# Patient Record
Sex: Male | Born: 1940 | Race: White | Hispanic: No | Marital: Married | Smoking: Former smoker
Health system: Southern US, Community
[De-identification: ages and names within clinical notes are randomized; demographics above are authoritative.]

## PROBLEM LIST (undated history)

## (undated) DIAGNOSIS — K7030 Alcoholic cirrhosis of liver without ascites: Secondary | ICD-10-CM

## (undated) HISTORY — PX: CATARACT EXTRACTION, BILATERAL: SHX1313

## (undated) HISTORY — DX: Alcoholic cirrhosis of liver without ascites: K70.30

---

## 2008-05-24 HISTORY — PX: UMBILICAL HERNIA REPAIR: SHX196

## 2009-06-05 ENCOUNTER — Encounter: Payer: Self-pay | Admitting: Family Medicine

## 2009-06-11 ENCOUNTER — Ambulatory Visit: Payer: Self-pay | Admitting: Family Medicine

## 2009-06-11 DIAGNOSIS — K7030 Alcoholic cirrhosis of liver without ascites: Secondary | ICD-10-CM | POA: Insufficient documentation

## 2009-06-11 DIAGNOSIS — J984 Other disorders of lung: Secondary | ICD-10-CM | POA: Insufficient documentation

## 2009-06-12 ENCOUNTER — Encounter: Payer: Self-pay | Admitting: Family Medicine

## 2009-06-12 LAB — CONVERTED CEMR LAB
AFP-Tumor Marker: 5.7 ng/mL (ref 0.0–8.0)
BUN: 18 mg/dL (ref 6–23)
CO2: 23 meq/L (ref 19–32)
Glucose, Bld: 103 mg/dL — ABNORMAL HIGH (ref 70–99)
HCT: 38.3 % — ABNORMAL LOW (ref 39.0–52.0)
Hemoglobin: 12.8 g/dL — ABNORMAL LOW (ref 13.0–17.0)
MCHC: 33.4 g/dL (ref 30.0–36.0)
MCV: 91.4 fL (ref 78.0–100.0)
Platelets: 198 10*3/uL (ref 150–400)
RDW: 15.5 % (ref 11.5–15.5)
Sodium: 134 meq/L — ABNORMAL LOW (ref 135–145)
Total Bilirubin: 0.8 mg/dL (ref 0.3–1.2)
Total Protein: 8.7 g/dL — ABNORMAL HIGH (ref 6.0–8.3)

## 2009-09-09 ENCOUNTER — Encounter: Payer: Self-pay | Admitting: Family Medicine

## 2009-10-10 ENCOUNTER — Encounter: Payer: Self-pay | Admitting: Family Medicine

## 2009-10-14 ENCOUNTER — Encounter: Payer: Self-pay | Admitting: Family Medicine

## 2009-10-21 ENCOUNTER — Encounter: Payer: Self-pay | Admitting: Family Medicine

## 2009-10-24 ENCOUNTER — Encounter: Payer: Self-pay | Admitting: Family Medicine

## 2009-10-31 ENCOUNTER — Ambulatory Visit: Payer: Self-pay | Admitting: Family Medicine

## 2009-11-01 ENCOUNTER — Encounter: Payer: Self-pay | Admitting: Family Medicine

## 2009-11-03 ENCOUNTER — Encounter: Payer: Self-pay | Admitting: Family Medicine

## 2009-11-03 LAB — CONVERTED CEMR LAB
ALT: <8 units/L (ref 0–53)
CO2: 21 meq/L (ref 19–32)
Calcium: 9.3 mg/dL (ref 8.4–10.5)
Chloride: 99 meq/L (ref 96–112)
Creatinine, Ser: 1.98 mg/dL — ABNORMAL HIGH (ref 0.40–1.50)
Glucose, Bld: 136 mg/dL — ABNORMAL HIGH (ref 70–99)
HCT: 29.5 % — ABNORMAL LOW (ref 39.0–52.0)
Platelets: 204 10*3/uL (ref 150–400)
RBC: 3.32 M/uL — ABNORMAL LOW (ref 4.22–5.81)
Sodium: 133 meq/L — ABNORMAL LOW (ref 135–145)
Total Protein: 7.8 g/dL (ref 6.0–8.3)
WBC: 7.2 10*3/uL (ref 4.0–10.5)

## 2009-12-01 ENCOUNTER — Telehealth: Payer: Self-pay | Admitting: Family Medicine

## 2010-01-08 ENCOUNTER — Ambulatory Visit: Payer: Self-pay | Admitting: Family Medicine

## 2010-01-08 DIAGNOSIS — R188 Other ascites: Secondary | ICD-10-CM

## 2010-01-08 DIAGNOSIS — D239 Other benign neoplasm of skin, unspecified: Secondary | ICD-10-CM | POA: Insufficient documentation

## 2010-01-09 ENCOUNTER — Encounter: Payer: Self-pay | Admitting: Family Medicine

## 2010-03-18 ENCOUNTER — Encounter: Payer: Self-pay | Admitting: Family Medicine

## 2010-04-07 ENCOUNTER — Encounter: Payer: Self-pay | Admitting: Family Medicine

## 2010-04-29 ENCOUNTER — Encounter: Payer: Self-pay | Admitting: Family Medicine

## 2010-05-01 ENCOUNTER — Encounter: Payer: Self-pay | Admitting: Family Medicine

## 2010-05-06 ENCOUNTER — Encounter: Payer: Self-pay | Admitting: Family Medicine

## 2010-05-28 ENCOUNTER — Encounter: Payer: Self-pay | Admitting: Family Medicine

## 2010-06-22 ENCOUNTER — Encounter: Payer: Self-pay | Admitting: Family Medicine

## 2010-06-23 NOTE — Consult Note (Signed)
Summary: [facility]   Imported By: Lanelle Bal 09/18/2009 10:51:13  _____________________________________________________________________  External Attachment:    Type:   Image     Comment:   External Document

## 2010-06-23 NOTE — Miscellaneous (Signed)
Summary: colonoscopy  Clinical Lists Changes  Observations: Added new observation of COLONOSCOPY: [facility] hx of cirrhosis, portal HTN, colon polyps  Sigmoid polyps, resected and retrieved. 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 scattered AVMs, non bleeding o/w normal.   (10/10/2009 11:49)      Colonoscopy  Procedure date:  10/10/2009  Findings:      [facility] hx of cirrhosis, portal HTN, colon polyps  Sigmoid polyps, resected and retrieved. 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 scattered AVMs, non bleeding o/w normal.     Colonoscopy  Procedure date:  10/10/2009  Findings:      [facility] hx of cirrhosis, portal HTN, colon polyps  Sigmoid polyps, resected and retrieved. 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 scattered AVMs, non bleeding o/w normal.

## 2010-06-23 NOTE — Consult Note (Signed)
Summary: [facility]   Imported By: Lanelle Bal 04/06/2010 08:01:26  _____________________________________________________________________  External Attachment:    Type:   Image     Comment:   External Document

## 2010-06-23 NOTE — Procedures (Signed)
Summary: Colonoscopy/[facility]  Colonoscopy/[facility]   Imported By: Lanelle Bal 10/23/2009 10:56:36  _____________________________________________________________________  External Attachment:    Type:   Image     Comment:   External Document

## 2010-06-23 NOTE — Progress Notes (Signed)
Summary: Potassium refill  Phone Note Refill Request   Refills Requested: Medication #1:  POTASSIUM CHLORIDE CR 10 MEQ CR-TABS Take 1 tab by mouth once daily Is this OK to refill?  Initial call taken by: Payton Spark CMA,  December 01, 2009 9:39 AM  Follow-up for Phone Call        NO.  Will STOP his potassium.   Follow-up by: Seymour Bars DO,  December 01, 2009 10:01 AM     Appended Document: Potassium refill Wife aware.Arvilla Market CMA, Michelle December 01, 2009 10:07 AM

## 2010-06-23 NOTE — Letter (Signed)
Summary: [facility]  [facility]   Imported By: Lanelle Bal 10/31/2009 14:11:02  _____________________________________________________________________  External Attachment:    Type:   Image     Comment:   External Document

## 2010-06-23 NOTE — Letter (Signed)
Summary: [facility]  [facility]   Imported By: Lanelle Bal 11/07/2009 11:47:44  _____________________________________________________________________  External Attachment:    Type:   Image     Comment:   External Document

## 2010-06-23 NOTE — Assessment & Plan Note (Signed)
Summary: NOV cirrhosis   Vital Signs:  Patient profile:   70 year old male Height:      70 inches Weight:      166 pounds BMI:     23.90 O2 Sat:      99 % on Room air Temp:     98.0 degrees F oral Pulse rate:   86 / minute BP sitting:   134 / 70  (left arm) Cuff size:   regular  Vitals Entered By: Payton Spark CMA (June 11, 2009 1:37 PM)  O2 Flow:  Room air CC: New to est   Primary Care Provider:  Seymour Bars DO  CC:  New to est.  History of Present Illness: 70 yo WM presents for NOV.  He was diagnosed with alcoholic cirrhosis in March of last year.  He is seeing [facility] for this.  He quit drinking Feb 2010.  He is on Spironolactone, Furosemide and Lactulose.  He has had ascites tapped 4 x already.  He had an EGD and colonoscopy last year and had colon polyps.  He has not had any upper GI tract bleeding.  He is not on a beta blocker.    He is seeing [facility] for a pulmonary nodule.  He smokes a ppd x 40+ yrs.  This has been followed with serial CT scans.  He has no complaints today.    Current Medications (verified): 1)  Spironolactone 50 Mg Tabs (Spironolactone) .... Take 4 Tabs By Mouth Daily 2)  Furosemide 80 Mg Tabs (Furosemide) .... Take 1 Tab By Mouth Once Daily 3)  Potassium Chloride Cr 10 Meq Cr-Tabs (Potassium Chloride) .... Take 1 Tab By Mouth Once Daily 4)  Prilosec Otc 20 Mg Tbec (Omeprazole Magnesium) .... Take 1 Tab By Mouth Once Daily 5)  Miralax  Pack (Polyethylene Glycol 3350) .... Use As Directed 6)  Milk Thistle 175 Mg Caps (Milk Thistle) .... Take 1 Cap By Mouth Once Daily 7)  Tramadol Hcl 50 Mg Tabs (Tramadol Hcl) .... Take As Directed As Needed 8)  Temazepam 30 Mg Caps (Temazepam) .... Take 1 Cap By Mouth Once Daily 9)  Lactulose 10 Gm/59ml Soln (Lactulose) .Marland Kitchen.. 15 Ml Once Daily  Allergies (verified): No Known Drug Allergies  Past History:  Past Medical History: Alcoholic Cirrhosis ( Dr Steffanie Dunn ) [facility] Nod[facility] )  getting f/u CT scans  Past Surgical History: cataract surgery 09, [facility]  Family History: 3 sisters  mother AMI 54s father CVA 12s, DM  Social History: Retired from [employer] work. Married (2nd marriage).  Has 3 children. Smokes 1 ppd x 40 yrs. Quit ETOH 06-2008. No exercise.  Review of Systems       no fevers/sweats/weakness, unexplained wt loss/gain, no change in vision, + difficulty hearing, ringing in ears, no hay fever/allergies, no CP/discomfort, no palpitations, no breast lump/nipple discharge, no cough/wheeze, no blood in stool,no  N/V/D, no nocturia, no leaking urine, no unusual vag bleeding, no vaginal/penile discharge, no muscle/joint pain, no rash, no new/changing mole, no HA, + memory loss, no anxiety, + sleep problem, no depression, no unexplained lumps, no easy bruising/bleeding, no concern with sexual function   Physical Exam  General:  alert, well-developed, well-nourished, and well-hydrated.  thin appearing man. here with wife. Head:  normocephalic and atraumatic.   Ears:  bilat cerumen impaction Nose:  no nasal discharge.   Mouth:  pharynx pink and moist.   Neck:  no masses.  no JVD Lungs:  prolonged exp phase with faint bibasilar exp wheezing.   Heart:  RRR w/o audible murmurs Abdomen:  reproducible umbilical hernia, quarter sized small fluid wave present.  NABS.  soft.  ND. Extremities:  no LE edema Neurologic:  no asterixis Skin:  color normal.   Cervical Nodes:  No lymphadenopathy noted Psych:  good eye contact, not anxious appearing, and not depressed appearing.     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  Fax to [facility] who is following him for his cirrhosis, complicated by encephalopathy (on lactulose) and ascites.  Asymptomatic at this point and no longer drinking.  No sign of GI bleeding.   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 He likely has COPD given exam findings and pack year hx.   Set up for PFTs.  Complete Medication List: 1)  Spironolactone 50 Mg Tabs (Spironolactone) .... Take 4 tabs by mouth daily 2)  Furosemide 80 Mg Tabs (Furosemide) .... Take 1 tab by mouth once daily 3)  Potassium Chloride Cr 10 Meq Cr-tabs (Potassium chloride) .... Take 1 tab by mouth once daily 4)  Prilosec Otc 20 Mg Tbec (Omeprazole magnesium) .... Take 1 tab by mouth once daily 5)  Miralax Pack (Polyethylene glycol 3350) .... Use as directed 6)  Milk Thistle 175 Mg Caps (Milk thistle) .... Take 1 cap by mouth once daily 7)  Tramadol Hcl 50 Mg Tabs (Tramadol hcl) .... Take as directed as needed 8)  Temazepam 30 Mg Caps (Temazepam) .... Take 1 cap by mouth once daily 9)  Lactulose 10 Gm/83ml Soln (Lactulose) .Marland Kitchen.. 15 ml once daily  Patient Instructions: 1)  Ear irrigation today. 2)  Labs downstairs today. 3)  Will call you w/ results tomorrow and send a copy to Dr Opal Sidles. 4)  Return for Pulmonary Function Testing in 1 month.

## 2010-06-23 NOTE — Letter (Signed)
Summary: [facility]   Imported By: Lanelle Bal 06/24/2009 14:23:48  _____________________________________________________________________  External Attachment:    Type:   Image     Comment:   External Document

## 2010-06-23 NOTE — Letter (Signed)
Summary: [facility]  [facility]   Imported By: Lanelle Bal 01/16/2010 12:20:36  _____________________________________________________________________  External Attachment:    Type:   Image     Comment:   External Document

## 2010-06-23 NOTE — Assessment & Plan Note (Signed)
Summary: skin lesion/ ascites   Vital Signs:  Patient profile:   70 year old male Height:      70 inches Weight:      183 pounds BMI:     26.35 O2 Sat:      97 % on Room air Pulse rate:   83 / minute BP sitting:   111 / 71  (left arm) Cuff size:   regular  Vitals Entered By: Payton Spark CMA (January 08, 2010 8:42 AM)  O2 Flow:  Room air CC: Black mole on R upper back   Primary Care Provider:  Seymour Bars DO  CC:  Black mole on R upper back.  History of Present Illness: 70yo WM seen today for a mole on the R upper back that his wife states has changed.  It started like the other SKs aroiund it but has become black with speckles.  No change in growth.  Asymptomatic per pt report.  His other question for me today is if he can schedule a paracentesis for his ascites.  It has been 2 mos since the last one and his wife reports having to wait 2+ hrs at [facility] in [place] at short stay last time.    His last OV with Dr Opal Sidles [facility]) was April 2011.  His hx is that of alcholic cirrhosis  with hepatic encephalopathy (hosp for in May 2011), Portal HTN, ascites, and ARF during hosp stay in May.  He notes that his abdomen feels tight.  Denies early satiety, N/V, blood in the stool, fevers or chills.  Denies wt loss or leg swelling.  Wife reports his mental status to be at baseline.  He is on lactulose to achieve 2-3 soft BMs/ day.  Last EGD was 08-2008 showing portal HTN gastropathy.  Last set of labs done 08-2009.  Quit drinking ETOH 2009.  Current Medications (verified): 1)  Spironolactone 50 Mg Tabs (Spironolactone) .... Take 4 Tabs By Mouth Daily 2)  Furosemide 80 Mg Tabs (Furosemide) .... Take 1 Tab By Mouth Twice A Day 3)  Prilosec Otc 20 Mg Tbec (Omeprazole Magnesium) .... Take 1 Tab By Mouth Once Daily 4)  Milk Thistle 175 Mg Caps (Milk Thistle) .... Take 1 Cap By Mouth Once Daily 5)  Lactulose 10 Gm/67ml Soln (Lactulose) .Marland Kitchen.. 15 Ml Once Daily  Allergies (verified): No Known Drug  Allergies  Past History:  Past Medical History: Alcoholic Cirrhosis ( Dr Steffanie Dunn ) [facility]od[facility] ) getting f/u CT scans  hepatic encephalopathy portal HTN Anemia Ascites Colon polyps  Social History: Reviewed history from 06/11/2009 and no changes required. Retired from [employer] work. Married (2nd marriage).  Has 3 children. Smokes 1 ppd x 40 yrs. Quit ETOH 06-2008. No exercise.  Review of Systems      See HPI  Physical Exam  General:  alert, well-developed, well-nourished, and well-hydrated.   Mouth:  pharynx pink and moist.   Neck:  no masses.   Lungs:  prolonged exp phase with faint bibasilar exp wheezing.   Heart:  RRR with 3/6 systolic ejection murmur Abdomen:  tight ascites with prominent bluish colored umbilical hernia.  non tender.   Extremities:  no LE edema Skin:  0.6 x 0.7 cm round black speckled nevus R upper back with nearby SK lesions  no jaundice spider angioma face and chest Psych:  good eye contact, not anxious appearing, and not depressed appearing.     Impression & Recommendations:  Problem # 1:  NEVUS, ATYPICAL (ICD-216.9)  Shave excision performed today w/o complication. Lesion cleaned w/ betadine and alcohol, 3 cc of 1% lidocaine iwth epi injected for local anesthesia. 15 blade used to remove lesion with ease.  Clean base.  used aluminum chloride for hemostasis. Bacitracin and a bandaid applied. Gave wound care instructions., Lesion sent to path.  Orders: Shave Skin Lesion 0.6-1.0 cm/trunk/arm/leg ([id number])  Problem # 2:  OTHER ASCITES (ICD-789.59) It has been 2 mos since last paracentesis and he feels uncofortable and 'tight' now.  Will get him scheduled for u/s guided paracentesis at [facility].  He is compliant with all of his meds for his cirrhosis.   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 He had labs done last in April 2011.  Complete Medication List: 1)  Spironolactone 50 Mg Tabs (Spironolactone) .... Take 4 tabs by mouth daily 2)  Furosemide 80 Mg Tabs (Furosemide) .... Take 1 tab by mouth twice a day 3)  Prilosec Otc 20 Mg Tbec (Omeprazole magnesium) .... Take 1 tab by mouth once daily 4)  Milk Thistle 175 Mg Caps (Milk thistle) .... Take 1 cap by mouth once daily 5)  Lactulose 10 Gm/51ml Soln (Lactulose) .Marland Kitchen.. 15 ml once daily  Patient Instructions: 1)  Will get you scheduled for a paracentesis. 2)  Marcelino Duster will call you w/ appt info. 3)  Your biopsy will be back next wk. 4)  Will call you w/ results. 5)  OK to wash lesion with soap and water. 6)  Cover with antibiotic ointment and a bandaid for the next wk. 7)  Call if any problems.   8)  Return for follow up visit in 6 wks.   Procedure Note  Biopsy:    Comment: Lidocaine 1% w/epi [id number] eXP. 11/12

## 2010-06-23 NOTE — Assessment & Plan Note (Signed)
Summary: HFU encephalopathy   Vital Signs:  Patient profile:   70 year old male Height:      70 inches Weight:      176 pounds Pulse rate:   90 / minute BP sitting:   109 / 58  (left arm) Cuff size:   regular  Vitals Entered By: Kathlene November (October 31, 2009 2:42 PM) CC: hospital followup- states feeling better   Primary Care Provider:  Seymour Bars DO  CC:  hospital followup- states feeling better.  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  He was admitted for encephalopathy after not taking his Lactulose.  His wife reports that his mental status is back to baseline.  He has about 3 loose stools/ day.  He is on Furosemide 80 mg two times a day and Sprionolactone.  Saw [facility] in April then went for a colonoscopy in May.  Had non- bleeding AVMs, portal HTN and ascites.  His ascites is getting worse but he denies early satiety or abdominal pain.  Denies hematememsis, nausea or hematochezia.        Current Medications (verified): 1)  Spironolactone 50 Mg Tabs (Spironolactone) .... Take 4 Tabs By Mouth Daily 2)  Furosemide 80 Mg Tabs (Furosemide) .... Take 1 Tab By Mouth Once Daily 3)  Potassium Chloride Cr 10 Meq Cr-Tabs (Potassium Chloride) .... Take 1 Tab By Mouth Once Daily 4)  Prilosec Otc 20 Mg Tbec (Omeprazole Magnesium) .... Take 1 Tab By Mouth Once Daily 5)  Milk Thistle 175 Mg Caps (Milk Thistle) .... Take 1 Cap By Mouth Once Daily 6)  Lactulose 10 Gm/13ml Soln (Lactulose) .Marland Kitchen.. 15 Ml Once Daily  Allergies (verified): No Known Drug Allergies  Comments:  Nurse/Medical Assistant: The patient's medications and allergies were reviewed with the patient and were updated in the Medication and Allergy Lists. Kathlene November (October 31, 2009 2:43 PM)  Past History:  Past Medical History: Reviewed history from 06/11/2009 and no changes required. Alcoholic Cirrhosis ( Dr Steffanie Dunn ) [facility] Lung Nod[facility] )  getting f/u CT scans  Social History: Reviewed history from 06/11/2009 and no changes required. Retired from [employer] work. Married (2nd marriage).  Has 3 children. Smokes 1 ppd x 40 yrs. Quit ETOH 06-2008. No exercise.  Review of Systems      See HPI  Physical Exam  General:  alert, well-developed, well-nourished, and well-hydrated.   Head:  normocephalic and atraumatic.   Eyes:  sclera non icteric Mouth:  pharynx pink and moist.   Neck:  no masses.  no JVD Lungs:  prolonged exp phase with faint bibasilar exp wheezing.   Heart:  RRR with 3/6 systolic ejection murmur Abdomen:  soft with large reducible umbilical hernia, moderate ascites with abdominal distension.  ND.  NABS. Extremities:  no LE edema Skin:  color normal.  no jaundice Psych:  Oriented X3, memory intact for recent and remote, good eye contact, not anxious appearing, and not depressed appearing.     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  This has cleared since he is back on Lactulose and recieved IVF rehydration.  Will recheck labs today.  His ascites does appear to be accumulating despite high doses of diuretics --> will get him back in with [facility] to set up paracentesis.  His Mental status appears to be back to baseline.  He was not having any GI bleeding during recent colonoscopy.   Orders: T-Comprehensive Metabolic Panel ([id number]) T-Lactic Acid. Venous [phone]) T-CBC No Diff ([id number])  Complete Medication List: 1)  Spironolactone 50 Mg Tabs (Spironolactone) .... Take 4 tabs by mouth daily 2)  Furosemide 80 Mg Tabs (Furosemide) .... Take 1 tab by mouth twice a day 3)  Potassium Chloride Cr 10 Meq Cr-tabs (Potassium chloride) .... Take 1 tab by mouth once daily 4)  Prilosec Otc 20 Mg Tbec (Omeprazole magnesium) .... Take 1 tab by mouth once daily 5)  Milk Thistle 175 Mg Caps (Milk  thistle) .... Take 1 cap by mouth once daily 6)  Lactulose 10 Gm/56ml Soln (Lactulose) .Marland Kitchen.. 15 ml once daily  Patient Instructions: 1)  Labs today. 2)  Will call you w/ results next wk. 3)  Stay on current meds. 4)  Will get you in with [facility]. 5)  Return for follow up in 3-4 mos.

## 2010-06-23 NOTE — Letter (Signed)
Summary: Letter Regarding Path Results/[facility]   Letter Regarding Path Results/[facility]   Imported By: Lanelle Bal 11/11/2009 11:55:00  _____________________________________________________________________  External Attachment:    Type:   Image     Comment:   External Document

## 2010-06-25 NOTE — Consult Note (Signed)
Summary: [facility]   Imported By: Lanelle Bal 05/08/2010 11:06:43  _____________________________________________________________________  External Attachment:    Type:   Image     Comment:   External Document

## 2010-06-25 NOTE — Letter (Signed)
Summary: [facility]  [facility]   Imported By: Lanelle Bal 06/03/2010 10:14:54  _____________________________________________________________________  External Attachment:    Type:   Image     Comment:   External Document

## 2010-07-16 ENCOUNTER — Encounter: Payer: Self-pay | Admitting: Family Medicine

## 2010-09-15 ENCOUNTER — Encounter: Payer: Self-pay | Admitting: Family Medicine

## 2010-09-29 ENCOUNTER — Ambulatory Visit (INDEPENDENT_AMBULATORY_CARE_PROVIDER_SITE_OTHER): Payer: BC Managed Care – PPO | Admitting: Family Medicine

## 2010-09-29 ENCOUNTER — Encounter: Payer: Self-pay | Admitting: Family Medicine

## 2010-09-29 VITALS — BP 121/71 | HR 80 | Ht 70.0 in | Wt 181.0 lb

## 2010-09-29 DIAGNOSIS — R600 Localized edema: Secondary | ICD-10-CM

## 2010-09-29 DIAGNOSIS — R609 Edema, unspecified: Secondary | ICD-10-CM

## 2010-09-29 LAB — POCT URINALYSIS DIPSTICK
Blood, UA: NEGATIVE
Glucose, UA: NEGATIVE
Spec Grav, UA: 1.025
Urobilinogen, UA: >=8
pH, UA: 6

## 2010-09-29 MED ORDER — FUROSEMIDE 80 MG PO TABS: 80.0000 mg | ORAL_TABLET | Freq: Every day | ORAL | Status: DC

## 2010-09-29 NOTE — Progress Notes (Signed)
  Subjective:    Patient ID: Christopher Wiley, male    DOB: 1941-03-08, 70 y.o.   MRN: [id number]  HPI 70 yo WM presents for L foot swelling worse at the end of the day x 2 wk.  Less swelling on the R side.  Little pain.  Swelling goes about 1/2 way up the L leg.  Denies problems voiding.  He is on Lasix 80 mg once daily (down from bid previously) per [facility] for ascites secondary to cirrhosis.  He is due for a paracentesis in 2 days and is having abd distension, increased DOE and early satiety.  Denies trauma to leg or calf pain.    BP 121/71  Pulse 80  Ht 5\' 10"  (1.778 m)  Wt 181 lb (82.101 kg)  BMI 25.97 kg/m2  SpO2 98%  Patient Active Problem List  Diagnoses  . NEVUS, ATYPICAL  . PULMONARY NODULE  . ALCOHOLIC CIRRHOSIS OF LIVER  . OTHER ASCITES       Review of Systems as per HPI     Objective:   Physical Exam  Constitutional: He appears well-developed and well-nourished.  HENT:  Head: Normocephalic and atraumatic.  Eyes: No scleral icterus.  Neck: Neck supple. No JVD present. No thyromegaly present.  Cardiovascular: Normal rate, regular rhythm and normal heart sounds.   Pulmonary/Chest: Effort normal and breath sounds normal. No respiratory distress. He has no wheezes. He has no rales.  Abdominal: He exhibits distension. There is no tenderness.       Ascites, moderate to severe  Musculoskeletal: He exhibits edema (L>R pitting LE edema, 1-2+ 1/3 way up LLE with varicosities, neg homans sign).  Lymphadenopathy:    He has no cervical adenopathy.  Skin: Skin is warm and dry.       bilat LE stasis dermatitis  Psychiatric: He has a normal mood and affect.          Assessment & Plan:

## 2010-09-29 NOTE — Assessment & Plan Note (Signed)
UA + for only trace protein.  Likley due to cirrhosis with portal HTN -- need for paracentesis (scheduled for 48 hrs from now).  Will check labs today.  No pain.  Will treat with leg elevation, compression hose, limited sodium consumption and continuing same dose of Lasix.  Wife will let me know if swelling improves after tap.

## 2010-09-29 NOTE — Patient Instructions (Signed)
UA:  Labs downstairs today.  Will call you w/ results tomorrow.  Swear compression hose on both legs during the day. Stick to a low sodium diet. Elevate legs while at rest.  Let me know if swelling improves or not after paracentesis is done.

## 2010-09-30 ENCOUNTER — Telehealth: Payer: Self-pay | Admitting: Family Medicine

## 2010-09-30 DIAGNOSIS — R609 Edema, unspecified: Secondary | ICD-10-CM

## 2010-09-30 LAB — COMPLETE METABOLIC PANEL WITH GFR
ALT: <8 U/L (ref 0–53)
Albumin: 3.2 g/dL — ABNORMAL LOW (ref 3.5–5.2)
CO2: 24 mEq/L (ref 19–32)
Calcium: 9.1 mg/dL (ref 8.4–10.5)
Chloride: 101 mEq/L (ref 96–112)
Creat: 1.17 mg/dL (ref 0.40–1.50)
GFR, Est African American: >60 mL/min (ref >60)
Potassium: 3.7 mEq/L (ref 3.5–5.3)

## 2010-09-30 LAB — TSH: TSH: 3.992 u[IU]/mL (ref 0.350–4.500)

## 2010-09-30 LAB — D-DIMER, QUANTITATIVE: D-Dimer, Quant: 10.78 ug/mL-FEU — ABNORMAL HIGH (ref 0.00–0.48)

## 2010-09-30 NOTE — Telephone Encounter (Signed)
Pls let pt know that his chemistry panel came back normal.  Thyroid function is normal.  His marker to r/o blood clots came back high, so will get an u/s on his L leg to be sure there is not a blood clot in his leg.

## 2010-09-30 NOTE — Telephone Encounter (Signed)
Scheduled today at 4pm. Pt aware

## 2010-10-01 ENCOUNTER — Telehealth: Payer: Self-pay | Admitting: Family Medicine

## 2010-10-01 NOTE — Telephone Encounter (Signed)
Pls make sure Christopher Wiley and his wife know that his venous doppler u/s came back negative for DVT.  I suspect his elevated D dimer was falsley elevated from his cirrhosis.

## 2010-10-01 NOTE — Telephone Encounter (Signed)
Pt aware of the above  

## 2010-10-08 ENCOUNTER — Telehealth: Payer: Self-pay | Admitting: Family Medicine

## 2010-10-08 NOTE — Telephone Encounter (Signed)
Patient's wife called and states that they DO NOT like his current doctor at [facility] and would like a new referral to go somewhere else other than [facility].

## 2010-10-08 NOTE — Telephone Encounter (Signed)
Pt scheduled w/ Dr. Theodore Demark for f/u at the end of June. Wife will see how apt goes and decide then if they want to change

## 2010-10-08 NOTE — Telephone Encounter (Signed)
I would be happy to, but most GI offices will not allow a transfer.  Is it possible for him to see another provider there?  Dr Noelle Penner is very nice.  Michellle, pls call [facility] to ask.  Thanks.

## 2010-10-08 NOTE — Telephone Encounter (Signed)
Dr. B's pt

## 2010-10-16 ENCOUNTER — Telehealth: Payer: Self-pay | Admitting: *Deleted

## 2010-10-16 ENCOUNTER — Encounter: Payer: Self-pay | Admitting: Family Medicine

## 2010-10-16 ENCOUNTER — Ambulatory Visit (INDEPENDENT_AMBULATORY_CARE_PROVIDER_SITE_OTHER): Payer: BC Managed Care – PPO | Admitting: Family Medicine

## 2010-10-16 VITALS — BP 119/68 | HR 89 | Temp 98.0°F | Ht 70.0 in | Wt 178.0 lb

## 2010-10-16 DIAGNOSIS — L0390 Cellulitis, unspecified: Secondary | ICD-10-CM | POA: Insufficient documentation

## 2010-10-16 MED ORDER — DOXYCYCLINE HYCLATE 100 MG PO CAPS: 100.0000 mg | ORAL_CAPSULE | Freq: Two times a day (BID) | ORAL | Status: AC

## 2010-10-16 NOTE — Progress Notes (Signed)
  Subjective:    Patient ID: Christopher Wiley, male    DOB: April 29, 1941, 70 y.o.   MRN: [id number]  HPI 70 yo WM presents for a non healing abrasion over the distal lateral L leg that started when he cut himself on the cardoor.  Denies bleeding or drainage from the wound.  He has been using neosporin and a bandaid.  The spot is a little tender.  His wife noticed yesterday a ring of redness, getting bigger, surrounding it.  He denies feeling bad or having a fever.  Unsure last tetanus vaccine.  BP 119/68  Pulse 89  Temp(Src) 98 F (36.7 C) (Oral)  Ht 5\' 10"  (1.778 m)  Wt 178 lb (80.74 kg)  BMI 25.54 kg/m2  SpO2 99% Patient Active Problem List  Diagnoses Date Noted  . Leg edema 09/29/2010  . NEVUS, ATYPICAL 01/08/2010  . OTHER ASCITES 01/08/2010  . PULMONARY NODULE 06/11/2009  . ALCOHOLIC CIRRHOSIS OF LIVER 06/11/2009       Review of Systems  Constitutional: Negative for fever and chills.       Objective:   Physical Exam  Constitutional: He appears well-developed and well-nourished. No distress.  Skin:       Small abrasion over distal, lateral L calf with a 2 cm ring of erythema surrounding with a small scale, warmth and slight tenderness          Assessment & Plan:

## 2010-10-16 NOTE — Assessment & Plan Note (Signed)
Localized cellulitis of the L leg secondary to an abrasion from 2 wks ago.  Will update his Tdap today and treat cellulitis with doxycycline bid x 10 days.  Keep lesion clean with soap and water and cover with polysporin ointment and gauze.  Call if any problems.  Call if fevers occur or redness increases.

## 2010-10-16 NOTE — Telephone Encounter (Signed)
Pt called and states med is not at pharm. [facility] [place]. Med was [facility]. [facility] [place]

## 2010-10-16 NOTE — Patient Instructions (Signed)
Take Doxycycline 2 x a day for localized cellulitis on leg. Clean wound with antibacterial soap and water 2 x a day and cover with polysporin ointment and gauze for 7-10 days.  Call me if any fevers, increased pain or spreading redness.

## 2010-11-27 ENCOUNTER — Encounter: Payer: Self-pay | Admitting: Family Medicine

## 2011-01-01 ENCOUNTER — Encounter: Payer: Self-pay | Admitting: Family Medicine

## 2011-01-01 ENCOUNTER — Ambulatory Visit (INDEPENDENT_AMBULATORY_CARE_PROVIDER_SITE_OTHER): Payer: BC Managed Care – PPO | Admitting: Family Medicine

## 2011-01-01 VITALS — BP 98/50 | HR 73 | Ht 70.0 in | Wt 160.0 lb

## 2011-01-01 DIAGNOSIS — K7030 Alcoholic cirrhosis of liver without ascites: Secondary | ICD-10-CM

## 2011-01-01 DIAGNOSIS — D239 Other benign neoplasm of skin, unspecified: Secondary | ICD-10-CM

## 2011-01-01 MED ORDER — TRIAMCINOLONE IN ABSORBASE 0.05 % EX OINT: TOPICAL_OINTMENT | CUTANEOUS | Status: DC

## 2011-01-01 MED ORDER — FUROSEMIDE 40 MG PO TABS: 40.0000 mg | ORAL_TABLET | Freq: Every day | ORAL | Status: AC

## 2011-01-01 NOTE — Progress Notes (Signed)
  Subjective:    Patient ID: Christopher Wiley, male    DOB: 31-Jan-1941, 70 y.o.   MRN: [id number]  HPI  70 yo WM presents for f/u visit.  He just saw Dr Opal Sidles and had labs drawn Monday.  He is on Lasix 80 mg once daily for alcoholic cirrhosis with secondary ascites and leg swelling.  He has not had a paracentesis done in 2 mos.  Was previously getting about once a month.  He is down 18 lbs in 3 mos but says he has a good appetite and is eating.  Denies problems voiding, abd pain or leg swelling.  No fevers, chills, weakness, SOb or lightheadedness.    BP 98/50  Pulse 73  Ht 5\' 10"  (1.778 m)  Wt 160 lb (72.576 kg)  BMI 22.96 kg/m2  SpO2 96%    Review of Systems  Constitutional: Positive for fatigue and unexpected weight change. Negative for fever.  Respiratory: Negative for shortness of breath.   Cardiovascular: Negative for chest pain, palpitations and leg swelling.  Gastrointestinal: Negative for abdominal pain, diarrhea, blood in stool and abdominal distention.  Genitourinary: Negative for difficulty urinating.  Skin: Negative for color change.  Neurological: Negative for light-headedness and headaches.       Objective:   Physical Exam  Constitutional: He appears well-developed and well-nourished.  HENT:  Mouth/Throat: Oropharynx is clear and moist.  Eyes: No scleral icterus.  Neck: No JVD present. No thyromegaly present.  Cardiovascular: Normal rate, regular rhythm and normal heart sounds.   Pulmonary/Chest: Effort normal. No respiratory distress. He has no wheezes.       prlonged exp phase,  Abdominal: Soft. Bowel sounds are normal. He exhibits no distension. There is no tenderness. There is no guarding.       No appreciable ascites  Musculoskeletal: He exhibits no edema.  Lymphadenopathy:    He has no cervical adenopathy.  Neurological:       No tremor  Skin: Skin is warm and dry.       No jaundice  Psychiatric: He has a normal mood and affect.            Assessment & Plan:  Alcoholic Cirrhosis: He is down 18 lbs in 3 mos and appears to be overdiuresed with low BPs.   Will cut back on Lasix from 80 mg once daily to 40 mg once daily.   Will get a copy of his labs from GI to see what his BUN/ Cr was. Warned pt's wife that his ascites might get worse by cutting back on Lasix.    F/u in 1 month.

## 2011-01-01 NOTE — Patient Instructions (Addendum)
Cut Lasix back to 40 mg once daily due to low BP  I will get a copy of your labs from Dr Opal Sidles.  Return for f/u ascites/ BP in 4 wks.

## 2011-01-06 ENCOUNTER — Other Ambulatory Visit: Payer: Self-pay | Admitting: Family Medicine

## 2011-01-06 NOTE — Telephone Encounter (Signed)
Pt's wife called and stated her husband was in to see Dr. Cathey Endow on 01-01-11.  She was suppose to have sent an electronic script for triam-absorbase ointment he applies to abd for buildup of fld.  Pt wife stating pharm doesn't have the script. Plan:  Called [facility] and they did not receive.  Gave verbal to the pharmacist.   Jarvis Newcomer, LPN [facility]

## 2011-02-02 ENCOUNTER — Encounter: Payer: Self-pay | Admitting: Family Medicine

## 2011-02-02 ENCOUNTER — Ambulatory Visit (INDEPENDENT_AMBULATORY_CARE_PROVIDER_SITE_OTHER): Payer: BC Managed Care – PPO | Admitting: Family Medicine

## 2011-02-02 VITALS — BP 119/59 | HR 61 | Ht 73.0 in | Wt 161.0 lb

## 2011-02-02 DIAGNOSIS — T148XXA Other injury of unspecified body region, initial encounter: Secondary | ICD-10-CM

## 2011-02-02 DIAGNOSIS — IMO0002 Reserved for concepts with insufficient information to code with codable children: Secondary | ICD-10-CM

## 2011-02-02 DIAGNOSIS — Z23 Encounter for immunization: Secondary | ICD-10-CM

## 2011-02-02 DIAGNOSIS — K7030 Alcoholic cirrhosis of liver without ascites: Secondary | ICD-10-CM

## 2011-02-02 DIAGNOSIS — L03113 Cellulitis of right upper limb: Secondary | ICD-10-CM

## 2011-02-02 MED ORDER — MUPIROCIN 2 % EX OINT: TOPICAL_OINTMENT | CUTANEOUS | Status: AC

## 2011-02-02 NOTE — Progress Notes (Signed)
  Subjective:    Patient ID: Christopher Wiley, male    DOB: [birth year], 70 y.o.   MRN: [id number]  HPI Patient is here for a flu shot.    Review of Systems     Objective:   Physical Exam        Assessment & Plan:  Flu shot given

## 2011-02-02 NOTE — Progress Notes (Signed)
  Subjective:    Patient ID: Christopher Wiley, male    DOB: November 03, 1940, 70 y.o.   MRN: [id number]  HPI Patient w/HX of cirrhosis . According to his wife this was DX2-3 years ago and that was when he stopped drinking. He was told he had 25% functioning  Liver. He has until recently getting frequent paracentesis by his gastroenterologist.     Review of Systems  Constitutional: Negative for activity change and appetite change.  Respiratory: Negative for shortness of breath.   Genitourinary:       Denies any recent ascities  Skin:       Patient has had recurrent bruising and reoccurrence skin tears He has one on his R arm that his wife is concerned that it might be infected       Objective:   Physical Exam  Constitutional:       Frail slightly cachetic  HENT:  Head: Normocephalic.  Cardiovascular: Normal rate and regular rhythm.   Pulmonary/Chest: Effort normal and breath sounds normal.  Abdominal: Soft. Bowel sounds are normal.       Liver about 4 finge rbreaths below R rib  Psychiatric: He is not slowed.       Memory was not always clear on some issue and wife had to correct him          Assessment & Plan:  Bactroban ointment  to his wound and follow up w/me in 4 months To see GI q 3 months

## 2011-02-03 ENCOUNTER — Telehealth: Payer: Self-pay | Admitting: Family Medicine

## 2011-02-03 NOTE — Telephone Encounter (Signed)
[facility] [facility] and pt was suppose to have gotten a Rx yesterday for Ab Tx sent, but the pharm is calling this morning stating they did not rec script. Plan:  Reviewed pt chart file and the med list indicates that a Rx for generic bactroban 2% was sent but pharm is claiming they do not have so a V.O was given. Jarvis Newcomer, LPN [facility]

## 2011-06-10 ENCOUNTER — Ambulatory Visit (INDEPENDENT_AMBULATORY_CARE_PROVIDER_SITE_OTHER): Payer: BC Managed Care – PPO | Admitting: Family Medicine

## 2011-06-10 ENCOUNTER — Encounter: Payer: Self-pay | Admitting: Family Medicine

## 2011-06-10 VITALS — BP 113/61 | HR 63 | Ht 73.0 in | Wt 176.0 lb

## 2011-06-10 DIAGNOSIS — K7030 Alcoholic cirrhosis of liver without ascites: Secondary | ICD-10-CM

## 2011-06-10 DIAGNOSIS — D849 Immunodeficiency, unspecified: Secondary | ICD-10-CM

## 2011-06-10 DIAGNOSIS — H9190 Unspecified hearing loss, unspecified ear: Secondary | ICD-10-CM

## 2011-06-10 DIAGNOSIS — Z23 Encounter for immunization: Secondary | ICD-10-CM

## 2011-06-10 MED ORDER — SPIRONOLACTONE 50 MG PO TABS: 200.0000 mg | ORAL_TABLET | Freq: Every day | ORAL | Status: AC

## 2011-06-10 MED ORDER — LACTULOSE 10 GM/15ML PO SOLN: 10.0000 g | ORAL | Status: DC

## 2011-06-10 NOTE — Progress Notes (Signed)
Subjective:     Patient ID: Christopher Wiley, male   DOB: Jun 20, 1940, 71 y.o.   MRN: [id number]  HPI Patient and his wife report less frequent paracentesis for his recurrent ascites and when aspiration is carried out the volume is a lot less  Review of Systems  HENT: Positive for hearing loss.    Ned immunization review   BP 113/61  Pulse 63  Ht 6\' 1"  (1.854 m)  Wt 176 lb (79.833 kg)  BMI 23.22 kg/m2  SpO2 100% Objective:   Physical Exam  Constitutional: He is oriented to person, place, and time. He appears well-developed and well-nourished.  HENT:  Head: Normocephalic.  Neck: Normal range of motion.  Cardiovascular: Normal rate, regular rhythm and normal heart sounds.   Pulmonary/Chest: Effort normal and breath sounds normal. No respiratory distress. He has no wheezes.  Abdominal: Soft. Bowel sounds are normal. He exhibits no distension. There is no tenderness. There is no rebound.       Liver is 3 fingerbreadth down from the rib margin. No significant distension  No fluid wave could be demonstrated  Musculoskeletal: Normal range of motion.  Neurological: He is alert and oriented to person, place, and time. He has normal reflexes.  Skin: Skin is warm and dry.  Psychiatric: He has a normal mood and affect.       Assessment:    Cirrhosis continue current treatment --;   Imunization update hearingloss     Plan:      #1Continue current medication follow up w/GI for paracentesis #2 Immunization  Update  : TDaP  And Pneumonia  vaccination will be given today #3hearing loss check in the future for plans that will cover hearing aids

## 2011-06-10 NOTE — Patient Instructions (Signed)
Cirrhosis Cirrhosis is a condition of scarring of the liver which is caused when the liver has tried repairing itself following damage. This damage may come from a previous infection such as one of the forms of hepatitis (usually hepatitis C), or the damage may come from being injured by toxins. The main toxin that causes this damage is alcohol. The scarring of the liver from use of alcohol is irreversible. That means the liver cannot return to normal even though alcohol is not used any more. The main danger of hepatitis C infection is that it may cause long-lasting (chronic) liver disease, and this also may lead to cirrhosis. This complication is progressive and irreversible. CAUSES  Prior to available blood tests, hepatitis C could be contracted by blood transfusions. Since testing of blood has improved, this is now unlikely. This infection can also be contracted through intravenous drug use and the sharing of needles. It can also be contracted through sexual relationships. The injury caused by alcohol comes from too much use. It is not a few drinks that poison the liver, but years of misuse. Usually there will be some signs and symptoms early with scarring of the liver that suggest the development of better habits. Alcohol should never be used while using acetaminophen. A small dose of both taken together may cause irreversible damage to the liver. HOME CARE INSTRUCTIONS  There is no specific treatment for cirrhosis. However, there are things you can do to avoid making the condition worse.  Rest as needed.   Eat a well-balanced diet. Your caregiver can help you with suggestions.   Vitamin supplements including vitamins A, K, D, and thiamine can help.   A low-salt diet, water restriction, or diuretic medicine may be needed to reduce fluid retention.   Avoid alcohol. This can be extremely toxic if combined with acetaminophen.   Avoid drugs which are toxic to the liver. Some of these include  isoniazid, methyldopa, acetaminophen, anabolic steroids (muscle-building drugs), erythromycin, and oral contraceptives (birth control pills). Check with your caregiver to make sure medicines you are presently taking will not be harmful.   Periodic blood tests may be required. Follow your caregiver's advice regarding the timing of these.   Milk thistle is an herbal remedy which does protect the liver against toxins. However, it will not help once the liver has been scarred.  SEEK MEDICAL CARE IF:  You have increasing fatigue or weakness.   You develop swelling of the hands, feet, legs, or face.   You vomit bright red blood, or a coffee ground appearing material.   You have blood in your stools, or the stools turn black and tarry.   You have a fever.   You develop loss of appetite, or have nausea and vomiting.   You develop jaundice.   You develop easy bruising or bleeding.   You have worsening of any of the problems you are concerned about.  Document Released: 05/10/2005 Document Revised: 01/20/2011 Document Reviewed: 12/27/2007 [facility] Patient Information 2012 [place], [place].

## 2011-06-11 ENCOUNTER — Other Ambulatory Visit: Payer: Self-pay | Admitting: *Deleted

## 2011-06-11 DIAGNOSIS — K7030 Alcoholic cirrhosis of liver without ascites: Secondary | ICD-10-CM

## 2011-06-11 MED ORDER — LACTULOSE 10 GM/15ML PO SOLN: 10.0000 g | ORAL | Status: DC

## 2011-12-09 ENCOUNTER — Encounter: Payer: Self-pay | Admitting: Family Medicine

## 2011-12-09 ENCOUNTER — Ambulatory Visit (INDEPENDENT_AMBULATORY_CARE_PROVIDER_SITE_OTHER): Payer: BC Managed Care – PPO | Admitting: Family Medicine

## 2011-12-09 ENCOUNTER — Telehealth: Payer: Self-pay | Admitting: *Deleted

## 2011-12-09 VITALS — BP 104/58 | HR 64 | Wt 169.0 lb

## 2011-12-09 DIAGNOSIS — Z1331 Encounter for screening for depression: Secondary | ICD-10-CM

## 2011-12-09 DIAGNOSIS — Z9181 History of falling: Secondary | ICD-10-CM

## 2011-12-09 DIAGNOSIS — L259 Unspecified contact dermatitis, unspecified cause: Secondary | ICD-10-CM

## 2011-12-09 DIAGNOSIS — L309 Dermatitis, unspecified: Secondary | ICD-10-CM

## 2011-12-09 DIAGNOSIS — Z23 Encounter for immunization: Secondary | ICD-10-CM

## 2011-12-09 DIAGNOSIS — K7460 Unspecified cirrhosis of liver: Secondary | ICD-10-CM

## 2011-12-09 MED ORDER — TRIAMCINOLONE IN ABSORBASE 0.05 % EX OINT: TOPICAL_OINTMENT | CUTANEOUS | Status: AC

## 2011-12-09 MED ORDER — TRIAMCINOLONE IN ABSORBASE 0.05 % EX OINT: TOPICAL_OINTMENT | CUTANEOUS | Status: DC

## 2011-12-09 MED ORDER — ZOSTER VACCINE LIVE 19400 UNT/0.65ML ~~LOC~~ SOLR: 0.6500 mL | Freq: Once | SUBCUTANEOUS | Status: AC

## 2011-12-09 NOTE — Patient Instructions (Signed)
Stasis Dermatitis Stasis dermatitis occurs when veins lose the ability to pump blood back to the heart (poor venous circulation). It causes a reddish-purple to brownish scaly, itchy rash on the legs. The rash comes from pooling of blood (stasis). CAUSES  This occurs because the veins do not work very well anymore or because pressure may be increased in the veins due to other conditions. With blood pooling, the increased pressure in the tiny blood vessels (capillaries) causes fluid to leak out of the capillaries into the tissue. The extra fluid makes it harder for the blood to feed the cells and get rid of waste products. SYMPTOMS  Stasis dermatitis appears as red, scaly, itchy patches on the legs. A yellowish or light brown discoloration is also present. Due to scratching or other injury, these patches can become an ulcer. This ulcer may remain for long periods of time. The ulcer can also become infected. Swelling of the legs is often present with stasis dermatitis. If the leg is swollen, this increases the risk of infection and further damage to the skin. Sometimes, intense itching, tingling, and burning occurs before signs of stasis dermatitis appear. You may find yourself scratching the insides of your ankles or rubbing your ankles together before the rash appears. After healing, there are often brown spots on the affected skin. DIAGNOSIS  Your caregiver makes this diagnosis based on an exam. Other tests may be done to better understand the cause. TREATMENT  If underlying conditions are present, they must be treated. Some of these conditions are heart failure, thyroid problems, poor nutrition, and varicose veins.  Cortisone creams and ointments applied to the skin (topically) may be needed, as well as medicine to reduce swelling in the legs (diuretics).   Compression stockings or an elastic wrap may also be needed to reduce swelling.   If there is an infection, antibiotic medicines may also be  used.  HOME CARE INSTRUCTIONS   Try to rest and raise (elevate) the affected leg above the level of the heart, if possible.   Burow's solution wet packs applied for 30 minutes, 3 times daily, will help the weepy rash. Stop using the packs before your skin gets too dry. You can also use a mixture of 3 parts white vinegar to 1 quart water.   Grease your legs daily with ointments, such as petroleum jelly, to fight dryness.   Avoid scratching or injuring the area.  SEEK IMMEDIATE MEDICAL CARE IF:   Your rash gets worse.   An ulcer forms.   You have an oral temperature above 102 F (38.9 C), not controlled by medicine.   You have any other severe symptoms.  Document Released: 08/19/2005 Document Revised: 04/29/2011 Document Reviewed: 09/29/2009 [facility] Patient Information 2012 [place], [place].

## 2011-12-09 NOTE — Telephone Encounter (Signed)
Called pharmacist and he said that the Elocon cream is similar to the triamcinolone. Please send to pharmacy. KG LPN

## 2011-12-09 NOTE — Telephone Encounter (Signed)
Pharmacy calls and the triamcinolone 0.05% cream is no longer available in that strength and quantity. They do have Triamcinolone 0.025% available 1 pound jar. Please advise

## 2011-12-09 NOTE — Progress Notes (Signed)
  Subjective:    Patient ID: Christopher Wiley, male    DOB: [birth year], 71 y.o.   MRN: [id number]  HPI He and his wife request cream that he's gotten herbefore. Apparently the cream is for chronic stasis dermatitis which they do not know the name of. In reviewing his chart it appears to be the triamcinolone cream mixed with absorbase. #2 cirrhosis assessment he is seeing the gastroenterologist things going well. Reports no problem. In fact his wife reports that he is eating everything. He is on Aldactone, Lasix , lactulose and Prilosec.  #3 immunization needs. He received 2 immunizations last visit but were not covered by his insurance she was not pleased. We'll give him a prescription for the zoster vaccination but if it is not covered and too expensive please let me know and I will document that.  #4 depression screen is needed. #5 fall assessment is needed for this year as well.  Review of Systems    BP 104/58  Pulse 64  Wt 169 lb (76.658 kg) Objective:   Physical Exam  Vitals reviewed. Constitutional: He is oriented to person, place, and time. He appears well-nourished. No distress.       Thin WM  HENT:  Head: Normocephalic.  Neck: Neck supple.  Cardiovascular: Normal rate and regular rhythm.   Pulmonary/Chest: Effort normal and breath sounds normal.  Abdominal: Soft. He exhibits no distension. There is no tenderness.  Neurological: He is alert and oriented to person, place, and time.  Skin: Skin is warm and dry. He is not diaphoretic. No erythema.       Should be noted chronic dermatitis of legs markedly improved  Psychiatric: He has a normal mood and affect.       jovial    Fall assessment value given as 0, For depression scale essentially judge a 0.     Assessment & Plan:  #1Chronic  Dermatitis. Renew cortisone cream and lotion mixture #2 cirrhosis doing well. Continue to follow with GI. I did request of his wife a copy of his lab work so that we can make sure there is  getting kidney functions and kidney values as well as his liver values. #3 immunization need. Prescription for zoster vaccination given performed he can get this filled at [facility]. #4 fall assessment no problems identified. #5 depression screen essentially normal.

## 2011-12-09 NOTE — Telephone Encounter (Signed)
Do they have something equivalent? Would elocon cream be equal?

## 2011-12-14 MED ORDER — ELOCON 0.1 % EX CREA: TOPICAL_CREAM | CUTANEOUS | Status: AC

## 2011-12-14 NOTE — Telephone Encounter (Signed)
i have written a scrip for elocon and Eucerin cream but I would go to a compounding pharmacist.

## 2011-12-14 NOTE — Telephone Encounter (Signed)
So did they substitute  the elocon cream or do we need to do something different?

## 2011-12-14 NOTE — Telephone Encounter (Signed)
Wife notified and instructed to take to [facility] in [place]

## 2011-12-14 NOTE — Telephone Encounter (Signed)
No they did not substitute they need a new script for this sent to them

## 2012-06-12 ENCOUNTER — Ambulatory Visit: Payer: BC Managed Care – PPO | Admitting: Family Medicine

## 2013-01-01 ENCOUNTER — Telehealth: Payer: Self-pay | Admitting: Family Medicine

## 2013-01-01 ENCOUNTER — Encounter: Payer: Self-pay | Admitting: Family Medicine

## 2013-01-01 ENCOUNTER — Ambulatory Visit (INDEPENDENT_AMBULATORY_CARE_PROVIDER_SITE_OTHER): Payer: BC Managed Care – PPO | Admitting: Family Medicine

## 2013-01-01 VITALS — BP 95/46 | HR 59 | Wt 179.0 lb

## 2013-01-01 DIAGNOSIS — L570 Actinic keratosis: Secondary | ICD-10-CM

## 2013-01-01 DIAGNOSIS — K7030 Alcoholic cirrhosis of liver without ascites: Secondary | ICD-10-CM

## 2013-01-01 DIAGNOSIS — L821 Other seborrheic keratosis: Secondary | ICD-10-CM

## 2013-01-01 MED ORDER — AMBULATORY NON FORMULARY MEDICATION: Status: AC

## 2013-01-01 NOTE — Telephone Encounter (Signed)
Called pt and scheduled CPE for 8.20.2014 @ 830. Informed him to fast for 8 hours nothing to eat after midnight however he can drink water, black coffee and tea without any sugar, cream or artificial sweeteners. Pt understood and agreed.Marland KitchenMarland KitchenLoralee Pacas [place]

## 2013-01-01 NOTE — Progress Notes (Signed)
Subjective:    Patient ID: Christopher Wiley, male    DOB: 1941/02/27, 72 y.o.   MRN: [id number]  HPI Alcoholic cirrhosis with ascites-still on his lactulose and diuretics.  Has endoscopy and colonoscopy scheduled in September.  Has been sober since 2010.  He has had Hep A/B vaccines.  His liver has been stable. Not on a transplant list.  His ascites is fairly well controlled on his parole at time.  Several skin lesion she wants to me look at at.  He has about 3 lesions on his back as well as several on his face. He does get a lot of sun exposure and does not wear sunscreen. None are particularly painful or itchy but he does have several on his face that are dry and crusting   Review of Systems     Objective:   Physical Exam  Constitutional: He is oriented to person, place, and time. He appears well-developed and well-nourished.  HENT:  Head: Normocephalic and atraumatic.  Cardiovascular: Normal rate, regular rhythm and normal heart sounds.   Pulmonary/Chest: Effort normal and breath sounds normal.  Neurological: He is alert and oriented to person, place, and time.  Skin: Skin is warm and dry.  He has 3 seborrheic keratoses on his back. He has multiple actinic keratoses on his face. One on his right ear 3 on his left ear, a larger area approximately 1 x 2 cm over his right temple and several smaller lesions on the forehead and on the right and left side of the nasal bridge.  Psychiatric: He has a normal mood and affect. His behavior is normal.          Assessment & Plan:  Alcoholic cirrhosis with ascites he is currently being followed by [facility]. It sounds like everything is in place. It sounds like his immunizations, including his hepatitis A and hepatitis B, are up to date we will call and get a copy to make sure that we have these on file as well. We will also call from his recent office visit from his gastroenterologist and lab work performed at her office. Fortunately, he has quit  drinking.  Actinic keratosis- discussed diagnosis and treatment options. These are typically caused by sun damage and can become squamous cell skin cancers if not treated. Recommend cryotherapy. He has multiple lesions on his face. He agrees to cryotherapy. Discussed the follow up wound care treatment. Also encouraged him to wear sunscreen on a regular basis considering he is usually outside for hours every day.  Tobacco abuse-encourage cessation.  Seborrheic keratoses-gave reassurance that these are benign.  Cryotherapy Procedure Note  Pre-operative Diagnosis: Actinic keratosis  Post-operative Diagnosis: Actinic keratosis  Locations: one on right ear, 3 on left ear, 1 on right temple.  4 on forehead and one on each side of nasal bridge   Indications: pre=-cancerous  Anesthesia: not required    Procedure Details  Patient informed of risks (permanent scarring, infection, light or dark discoloration, bleeding, infection, weakness, numbness and recurrence of the lesion) and benefits of the procedure and verbal informed consent obtained.  The areas are treated with liquid nitrogen therapy, frozen until ice ball extended 2 mm beyond lesion, allowed to thaw, and treated again. The patient tolerated procedure well.  The patient was instructed on post-op care, warned that there may be blister formation, redness and pain. Recommend OTC analgesia as needed for pain.  Condition: Stable  Complications: none.  Plan: 1. Instructed to keep the area dry and covered  for 24-48h and clean thereafter. 2. Warning signs of infection were reviewed.   3. Recommended that the patient use OTC acetaminophen as needed for pain.  4. Return as needed.

## 2013-01-01 NOTE — Telephone Encounter (Signed)
Please call patient. Encouraged him to schedule a Medicare wellness exam. This will give [place] an opportunity to go over some of his preventative care. I would also like to get a screening cholesterol on him when he does that. I do not have an up-to-date one and I suspect that they have not checked one , but I will look over his records once I  get a copy of them.

## 2013-01-10 ENCOUNTER — Encounter: Payer: Self-pay | Admitting: Family Medicine

## 2013-01-10 ENCOUNTER — Ambulatory Visit (INDEPENDENT_AMBULATORY_CARE_PROVIDER_SITE_OTHER): Payer: BC Managed Care – PPO | Admitting: Family Medicine

## 2013-01-10 ENCOUNTER — Encounter: Payer: Self-pay | Admitting: *Deleted

## 2013-01-10 VITALS — BP 104/54 | HR 82 | Wt 181.0 lb

## 2013-01-10 DIAGNOSIS — Z136 Encounter for screening for cardiovascular disorders: Secondary | ICD-10-CM

## 2013-01-10 DIAGNOSIS — K7030 Alcoholic cirrhosis of liver without ascites: Secondary | ICD-10-CM

## 2013-01-10 DIAGNOSIS — Z125 Encounter for screening for malignant neoplasm of prostate: Secondary | ICD-10-CM

## 2013-01-10 DIAGNOSIS — J984 Other disorders of lung: Secondary | ICD-10-CM

## 2013-01-10 DIAGNOSIS — Z0000 Encounter for general adult medical examination without abnormal findings: Secondary | ICD-10-CM

## 2013-01-10 LAB — COMPLETE METABOLIC PANEL WITH GFR
Albumin: 3.8 g/dL (ref 3.5–5.2)
Alkaline Phosphatase: 80 U/L (ref 39–117)
BUN: 16 mg/dL (ref 6–23)
GFR, Est Non African American: 45 mL/min — ABNORMAL LOW
Glucose, Bld: 113 mg/dL — ABNORMAL HIGH (ref 70–99)
Potassium: 4.3 mEq/L (ref 3.5–5.3)

## 2013-01-10 LAB — CBC WITH DIFFERENTIAL/PLATELET
Eosinophils Relative: 3 % (ref 0–5)
HCT: 39 % (ref 39.0–52.0)
Lymphocytes Relative: 12 % (ref 12–46)
Lymphs Abs: 0.7 10*3/uL (ref 0.7–4.0)
MCV: 88.6 fL (ref 78.0–100.0)
Monocytes Absolute: 0.5 10*3/uL (ref 0.1–1.0)
Monocytes Relative: 9 % (ref 3–12)
RBC: 4.4 MIL/uL (ref 4.22–5.81)
RDW: 14.2 % (ref 11.5–15.5)
WBC: 5.7 10*3/uL (ref 4.0–10.5)

## 2013-01-10 LAB — LIPID PANEL
Cholesterol: 108 mg/dL (ref 0–200)
Total CHOL/HDL Ratio: 3.4 Ratio

## 2013-01-10 NOTE — Addendum Note (Signed)
Addended by: Deno Etienne on: 01/10/2013 09:14 AM   Modules accepted: Orders

## 2013-01-10 NOTE — Progress Notes (Signed)
Subjective:    Christopher Wiley is a 72 y.o. male who presents for Medicare Annual/Subsequent preventive examination.   Preventive Screening-Counseling & Management  Tobacco History  Smoking status  . Current Every Day Smoker  Smokeless tobacco  . Not on file    Problems Prior to Visit 1.   Current Problems (verified) Patient Active Problem List   Diagnosis Date Noted  . Chronic dermatitis of feet 12/09/2011  . Leg edema 09/29/2010  . OTHER ASCITES 01/08/2010  . PULMONARY NODULE 06/11/2009  . ALCOHOLIC CIRRHOSIS OF LIVER 06/11/2009    Medications Prior to Visit Current Outpatient Prescriptions on File Prior to Visit  Medication Sig Dispense Refill  . AMBULATORY NON FORMULARY MEDICATION Medication Name: shingles vaccine  1 Units  0  . lactulose (CHRONULAC) 10 GM/15ML solution Take 15 mLs (10 g total) by mouth 1 day or 1 dose.  240 mL  6  . omeprazole (PRILOSEC) 20 MG capsule Take 20 mg by mouth daily.        Marland Kitchen spironolactone (ALDACTONE) 50 MG tablet Take 4 tablets (200 mg total) by mouth daily. Take 4 tabs po qd  120 tablet  5  . Triamcinolone Acetonide (TRIAMCINOLONE IN ABSORBASE) 0.05 % OINT Use bid  430 g  2  . furosemide (LASIX) 40 MG tablet Take 1 tablet (40 mg total) by mouth daily.  30 tablet  11   No current facility-administered medications on file prior to visit.    Current Medications (verified) Current Outpatient Prescriptions  Medication Sig Dispense Refill  . AMBULATORY NON FORMULARY MEDICATION Medication Name: shingles vaccine  1 Units  0  . lactulose (CHRONULAC) 10 GM/15ML solution Take 15 mLs (10 g total) by mouth 1 day or 1 dose.  240 mL  6  . omeprazole (PRILOSEC) 20 MG capsule Take 20 mg by mouth daily.        Marland Kitchen spironolactone (ALDACTONE) 50 MG tablet Take 4 tablets (200 mg total) by mouth daily. Take 4 tabs po qd  120 tablet  5  . Triamcinolone Acetonide (TRIAMCINOLONE IN ABSORBASE) 0.05 % OINT Use bid  430 g  2  . furosemide (LASIX) 40 MG tablet  Take 1 tablet (40 mg total) by mouth daily.  30 tablet  11   No current facility-administered medications for this visit.     Allergies (verified) Review of patient's allergies indicates no known allergies.   PAST HISTORY  Family History No family history on file.  Social History History  Substance Use Topics  . Smoking status: Current Every Day Smoker  . Smokeless tobacco: Not on file  . Alcohol Use: Not on file    Are there smokers in your home (other than you)?  No  Risk Factors Current exercise habits: very active  Dietary issues discussed: none  Cardiac risk factors: advanced age (older than 48 for men, 29 for women).  Depression Screen (Note: if answer to either of the following is "Yes", a more complete depression screening is indicated)   Q1: Over the past two weeks, have you felt down, depressed or hopeless? No  Q2: Over the past two weeks, have you felt little interest or pleasure in doing things? No  Have you lost interest or pleasure in daily life? No  Do you often feel hopeless? No  Do you cry easily over simple problems? No  Activities of Daily Living In your present state of health, do you have any difficulty performing the following activities?:  Driving? No Managing money?  No Feeding yourself? No Getting from bed to chair? No Climbing a flight of stairs? No Preparing food and eating?: No Bathing or showering? No Getting dressed: No Getting to the toilet? No Using the toilet:No Moving around from place to place: No In the past year have you fallen or had a near fall?:No   Are you sexually active?  No  Do you have more than one partner?  No  Hearing Difficulties: Yes Do you often ask people to speak up or repeat themselves? Yes Do you experience ringing or noises in your ears? No Do you have difficulty understanding soft or whispered voices? Yes   Do you feel that you have a problem with memory? No  Do you often misplace items? No  Do you  feel safe at home?  Yes  Cognitive Testing  Alert? Yes  Normal Appearance?Yes  Oriented to person? Yes  Place? Yes   Time? Yes  Recall of three objects?  Yes  Can perform simple calculations? Yes  Displays appropriate judgment?Yes  Can read the correct time from a watch face?Yes   Advanced Directives have been discussed with the patient? Yes   List the Names of Other Physician/Practitioners you currently use: 1.    Indicate any recent Medical Services you may have received from other than [facility] providers in the past year (date may be approximate).  Immunization History  Administered Date(s) Administered  . Influenza Whole 02/02/2011  . Pneumococcal Polysaccharide 06/10/2011  . Tdap 06/10/2011    Screening Tests Health Maintenance  Topic Date Due  . Zostavax  09/19/2000  . Influenza Vaccine  01/22/2013  . Colonoscopy  09/08/2018  . Tetanus/tdap  06/09/2021  . Pneumococcal Polysaccharide Vaccine Age 25 And Over  Completed    All answers were reviewed with the patient and necessary referrals were made:  [name], MD   01/10/2013   History reviewed: allergies, current medications, past family history, past medical history, past social history, past surgical history and problem list  Review of Systems A comprehensive review of systems was negative.    Objective:     Vision by Snellen chart: right eye:20/40, left eye:20/50 Blood pressure 104/54, pulse 82, weight 181 lb (82.101 kg). Body mass index is 23.89 kg/(m^2).  BP 104/54  Pulse 82  Wt 181 lb (82.101 kg)  BMI 23.89 kg/m2  General Appearance:    Alert, cooperative, no distress, appears stated age  Head:    Normocephalic, without obvious abnormality, atraumatic  Eyes:    PERRL, conjunctiva/corneas clear, EOM's intact, fundi    benign, both eyes       Ears:    Normal TM's and external ear canals, both ears  Nose:   Nares normal, septum midline, mucosa normal, no drainage    or sinus tenderness   Throat:   Lips, mucosa, and tongue normal; teeth and gums normal  Neck:   Supple, symmetrical, trachea midline, no adenopathy;       thyroid:  No enlargement/tenderness/nodules; no carotid   bruit or JVD  Back:     Symmetric, no curvature, ROM normal, no CVA tenderness  Lungs:     Clear to auscultation bilaterally, respirations unlabored  Chest wall:    No tenderness or deformity  Heart:    Regular rate and rhythm, S1 and S2 normal, no murmur, rub   or gallop  Abdomen:     Soft, non-tender, bowel sounds active all four quadrants,    no masses, no organomegaly  Genitalia:  Not examined  Rectal:    Normal tone, mildly enlarged prostate, no masses or tenderness;   guaiac negative stool  Extremities:   Extremities normal, atraumatic, no cyanosis or edema  Pulses:   2+ and symmetric all extremities  Skin:   Skin color, texture, turgor normal, no rashes or lesions  Lymph nodes:   Cervical, supraclavicular, and axillary nodes normal  Neurologic:   CNII-XII intact. Normal strength, sensation and reflexes      throughout       Assessment:     Annual Medicare Wellness Exam       Plan:     During the course of the visit the patient was educated and counseled about appropriate screening and preventive services including:    shingles vaccine - get at pharmacy  Return in for flu shot this fall  EKG shows rate of 74 bpm, normal axis.  , no acute changes.    BPH - he feels his sxs are not bothersome.    Encourage yearly eye exam  Diet review for nutrition referral? Yes ____  Not Indicated _x__   Patient Instructions (the written plan) was given to the patient.  Medicare Attestation I have personally reviewed: The patient's medical and social history Their use of alcohol, tobacco or illicit drugs Their current medications and supplements The patient's functional ability including ADLs,fall risks, home safety risks, cognitive, and hearing and visual impairment Diet and  physical activities Evidence for depression or mood disorders  The patient's weight, height, BMI, and visual acuity have been recorded in the chart.  I have made referrals, counseling, and provided education to the patient based on review of the above and I have provided the patient with a written personalized care plan for preventive services.     Eloisa Chokshi, MD   01/10/2013

## 2013-01-10 NOTE — Patient Instructions (Signed)
Keep up a regular exercise program and make sure you are eating a healthy diet Try to eat 4 servings of dairy a day, or if you are lactose intolerant take a calcium with vitamin D daily.  Your vaccines are up to date.   

## 2013-01-11 ENCOUNTER — Encounter: Payer: Self-pay | Admitting: Family Medicine

## 2013-01-11 ENCOUNTER — Other Ambulatory Visit: Payer: Self-pay | Admitting: *Deleted

## 2013-01-11 DIAGNOSIS — N183 Chronic kidney disease, stage 3 (moderate): Secondary | ICD-10-CM

## 2013-01-15 ENCOUNTER — Other Ambulatory Visit: Payer: Self-pay | Admitting: *Deleted

## 2013-01-15 DIAGNOSIS — E870 Hyperosmolality and hypernatremia: Secondary | ICD-10-CM

## 2013-01-31 ENCOUNTER — Ambulatory Visit (INDEPENDENT_AMBULATORY_CARE_PROVIDER_SITE_OTHER): Payer: BC Managed Care – PPO

## 2013-01-31 DIAGNOSIS — Z23 Encounter for immunization: Secondary | ICD-10-CM

## 2013-02-14 LAB — BASIC METABOLIC PANEL
BUN: 9 mg/dL (ref 6–23)
CO2: 28 mEq/L (ref 19–32)
Chloride: 101 mEq/L (ref 96–112)
Creat: 1.33 mg/dL (ref 0.50–1.35)

## 2013-02-14 NOTE — Progress Notes (Signed)
Quick Note:  All labs are normal. ______ 

## 2013-05-15 ENCOUNTER — Telehealth: Payer: Self-pay | Admitting: *Deleted

## 2013-05-15 NOTE — Telephone Encounter (Signed)
Pt's wife called and reported that he has been having trouble sleeping and wanted to know if there was a sleeping aid that he can take OTC .  Spoke with dr.metheny and she recommended that he take either benadryl 25-50 mg or valerian root caps. Informed pt's wife of this she voiced understanding and agreed.Loralee Pacas [place]

## 2013-07-14 ENCOUNTER — Encounter: Payer: Self-pay | Admitting: Family Medicine

## 2013-07-14 DIAGNOSIS — Z8719 Personal history of other diseases of the digestive system: Secondary | ICD-10-CM | POA: Insufficient documentation

## 2013-08-21 ENCOUNTER — Ambulatory Visit (INDEPENDENT_AMBULATORY_CARE_PROVIDER_SITE_OTHER): Payer: Medicare Other

## 2013-08-21 ENCOUNTER — Ambulatory Visit (INDEPENDENT_AMBULATORY_CARE_PROVIDER_SITE_OTHER): Payer: BC Managed Care – PPO | Admitting: Family Medicine

## 2013-08-21 ENCOUNTER — Encounter: Payer: Self-pay | Admitting: Family Medicine

## 2013-08-21 VITALS — BP 112/73 | HR 85 | Wt 177.0 lb

## 2013-08-21 DIAGNOSIS — R0602 Shortness of breath: Secondary | ICD-10-CM

## 2013-08-21 DIAGNOSIS — K7030 Alcoholic cirrhosis of liver without ascites: Secondary | ICD-10-CM

## 2013-08-21 DIAGNOSIS — D62 Acute posthemorrhagic anemia: Secondary | ICD-10-CM

## 2013-08-21 NOTE — Progress Notes (Signed)
Subjective:    Patient ID: Christopher Wiley, male    DOB: [birth year], 73 y.o.   MRN: [id number]  HPI Here for followup alcoholic cirrhosis- His GI had been encouraging him to get EGD to eval for varices but didn't make it.  They started bleeding and has had several blood tranfusions.  Has had 3 procedures to band.  Still getting a lot of ascities. Still sees Dr. Patrick North for his cirrhosis.  Had paracentesis a week ago.  No energy and feels SOB.  Appetite has been down.  Has been afraid to eat much bc of the varices.  No cough or change in sputum.   CKD - his wife that he did have a injury to his kidneys when he had a variceal bleed. She thinks that that has been back to normal.  His wife and wonders if his blood count could still be low even though he did receive several transfusions. He says been so weak in his energy level has been so low. He's also been short of breath with activity. No chest pain.   Review of Systems  BP 112/73  Pulse 85  Wt 177 lb (80.287 kg)  SpO2 94%    No Known Allergies  Past Medical History  Diagnosis Date  . Alcoholic cirrhosis of liver     patient sees Dr Emilia Beck for pleuracentisis     Past Surgical History  Procedure Laterality Date  . Umbilical hernia repair  2010  . Cataract extraction, bilateral      History   Social History  . Marital Status: Married    Spouse Name: N/A    Number of Children: N/A  . Years of Education: N/A   Occupational History  . Not on file.   Social History Main Topics  . Smoking status: Former Smoker    Types: Cigarettes    Quit date: 05/24/2010  . Smokeless tobacco: Not on file  . Alcohol Use: Not on file  . Drug Use: Not on file  . Sexual Activity: Not on file   Other Topics Concern  . Not on file   Social History Narrative  . No narrative on file    No family history on file.  Outpatient Encounter Prescriptions as of 08/21/2013  Medication Sig  . AMBULATORY NON FORMULARY MEDICATION Medication Name:  shingles vaccine  . GENERLAC 10 GM/15ML SOLN   . omeprazole (PRILOSEC) 20 MG capsule Take 20 mg by mouth daily.    . propranolol (INDERAL) 10 MG tablet   . spironolactone (ALDACTONE) 50 MG tablet Take 4 tablets (200 mg total) by mouth daily. Take 4 tabs po qd  . Triamcinolone Acetonide (TRIAMCINOLONE IN ABSORBASE) 0.05 % OINT Use bid  . furosemide (LASIX) 40 MG tablet Take 1 tablet (40 mg total) by mouth daily.  . [DISCONTINUED] lactulose (CHRONULAC) 10 GM/15ML solution Take 15 mLs (10 g total) by mouth 1 day or 1 dose.          Objective:   Physical Exam  Constitutional: He is oriented to person, place, and time. He appears well-developed and well-nourished.  HENT:  Head: Normocephalic and atraumatic.  Cardiovascular: Normal rate, regular rhythm and normal heart sounds.   Pulmonary/Chest: Effort normal and breath sounds normal.  Neurological: He is alert and oriented to person, place, and time.  Skin: Skin is warm and dry.  Psychiatric: He has a normal mood and affect. His behavior is normal.          Assessment & Plan:  Alcoholic cirrhosis - Will check CBC.  Could cause his fatigue.   CKD- will recheck kidney function to see if he is back at his baseline. Topic he may have had acute renal failure during his variceal bleed.  SOB - not likely infection. Likely from his ascites and explaine how this happens. Will check for anemia.  Last CXR was awhile. We'll get a chest x-ray. Pulse ox is 94%. This was breath is worse with activity.  Decreased appetite-now that he's had banding done and on his last dose there were no more varices I encouraged him to eat more normally. They did recommend he avoid carbonated beverages as he has been drinking nose.

## 2013-08-22 ENCOUNTER — Other Ambulatory Visit: Payer: Self-pay | Admitting: Family Medicine

## 2013-08-22 DIAGNOSIS — R9389 Abnormal findings on diagnostic imaging of other specified body structures: Secondary | ICD-10-CM

## 2013-08-22 LAB — COMPLETE METABOLIC PANEL WITH GFR
ALK PHOS: 615 U/L — AB (ref 39–117)
ALT: 35 U/L (ref 0–53)
AST: 178 U/L — AB (ref 0–37)
Albumin: 2.6 g/dL — ABNORMAL LOW (ref 3.5–5.2)
BUN: 55 mg/dL — ABNORMAL HIGH (ref 6–23)
CALCIUM: 8.9 mg/dL (ref 8.4–10.5)
CO2: 22 mEq/L (ref 19–32)
Chloride: 92 mEq/L — ABNORMAL LOW (ref 96–112)
Creat: 2.23 mg/dL — ABNORMAL HIGH (ref 0.50–1.35)
GFR, Est African American: 33 mL/min — ABNORMAL LOW
GFR, Est Non African American: 28 mL/min — ABNORMAL LOW
GLUCOSE: 101 mg/dL — AB (ref 70–99)
POTASSIUM: 5.5 meq/L — AB (ref 3.5–5.3)
SODIUM: 123 meq/L — AB (ref 135–145)
TOTAL PROTEIN: 6.8 g/dL (ref 6.0–8.3)
Total Bilirubin: 2.1 mg/dL — ABNORMAL HIGH (ref 0.2–1.2)

## 2013-08-22 LAB — CBC WITH DIFFERENTIAL/PLATELET
BASOS ABS: 0 10*3/uL (ref 0.0–0.1)
Basophils Relative: 0 % (ref 0–1)
Eosinophils Absolute: 0.1 10*3/uL (ref 0.0–0.7)
Eosinophils Relative: 1 % (ref 0–5)
HEMATOCRIT: 36.6 % — AB (ref 39.0–52.0)
HEMOGLOBIN: 12.2 g/dL — AB (ref 13.0–17.0)
LYMPHS ABS: 1.4 10*3/uL (ref 0.7–4.0)
LYMPHS PCT: 12 % (ref 12–46)
MCH: 28 pg (ref 26.0–34.0)
MCHC: 33.3 g/dL (ref 30.0–36.0)
MCV: 84.1 fL (ref 78.0–100.0)
MONO ABS: 0.8 10*3/uL (ref 0.1–1.0)
Monocytes Relative: 7 % (ref 3–12)
NEUTROS ABS: 9.2 10*3/uL — AB (ref 1.7–7.7)
Neutrophils Relative %: 80 % — ABNORMAL HIGH (ref 43–77)
Platelets: 203 10*3/uL (ref 150–400)
RBC: 4.35 MIL/uL (ref 4.22–5.81)
RDW: 17 % — AB (ref 11.5–15.5)
WBC: 11.5 10*3/uL — AB (ref 4.0–10.5)

## 2013-08-23 ENCOUNTER — Telehealth: Payer: Self-pay | Admitting: *Deleted

## 2013-08-23 NOTE — Telephone Encounter (Signed)
Precert obtained through [facility] Medicare Complete online for CT Chest w/o contrast.  Auth# [id number] good from 08/23/13 to 10/07/13.  [facility] notified. Clemetine Marker, LPN

## 2013-08-27 ENCOUNTER — Other Ambulatory Visit: Payer: Medicare Other

## 2013-08-27 ENCOUNTER — Telehealth: Payer: Self-pay | Admitting: *Deleted

## 2013-08-27 NOTE — Telephone Encounter (Signed)
Nurse with [facility] calls and states they received a referral from [facility].  This patient will be discharged from [place] back to the home for in home hospice care and they wanted to know if you would be the attending physician?  Please advise

## 2013-08-27 NOTE — Telephone Encounter (Signed)
Yes, I am ok with being attending physician for Hospice.

## 2013-08-28 ENCOUNTER — Telehealth: Payer: Self-pay | Admitting: *Deleted

## 2013-08-28 NOTE — Telephone Encounter (Signed)
Hospice nurse notified. Clemetine Marker, LPN

## 2013-08-28 NOTE — Telephone Encounter (Signed)
[place] nurse from hospice called to inform you that pt has went into hospice at home. States she has taken his medications and all to his home and states it probably wont be long. FYI.

## 2013-08-28 NOTE — Telephone Encounter (Signed)
:   Let a message on home phone with,. We were thinking about her and hoping that she and Brendyn are doing okay. I encouraged her to call her office if she needs anything. I let her know that we also signed off on hospice orders. Per records from [facility] he was diagnosed with metastatic cancer.

## 2013-08-29 ENCOUNTER — Telehealth: Payer: Self-pay | Admitting: Family Medicine

## 2013-08-29 NOTE — Telephone Encounter (Signed)
Called before midnight last night by "[place]" @ Hospice relaying that patient passed away at 11:20pm.  Family reportedly did not request autopsy.  Verbal order given for body to be released to funeral home of choice.

## 2013-08-30 NOTE — Telephone Encounter (Signed)
Called and [facility] for Christopher Wiley his wife.  Asked [place] to call us ifs he needs antying. She is a patient her.  Gave our condolences.

## 2013-09-21 DEATH — deceased

## 2014-02-19 ENCOUNTER — Ambulatory Visit: Payer: Medicare Other | Admitting: Family Medicine

## 2015-06-19 IMAGING — CR DG CHEST 2V
2 series · 2 of 2 positions shown · non-contrast
Comparison: None.

CLINICAL DATA: Shortness of breath

EXAM:
CHEST  2 VIEW

[view not recorded (1 of 2)]
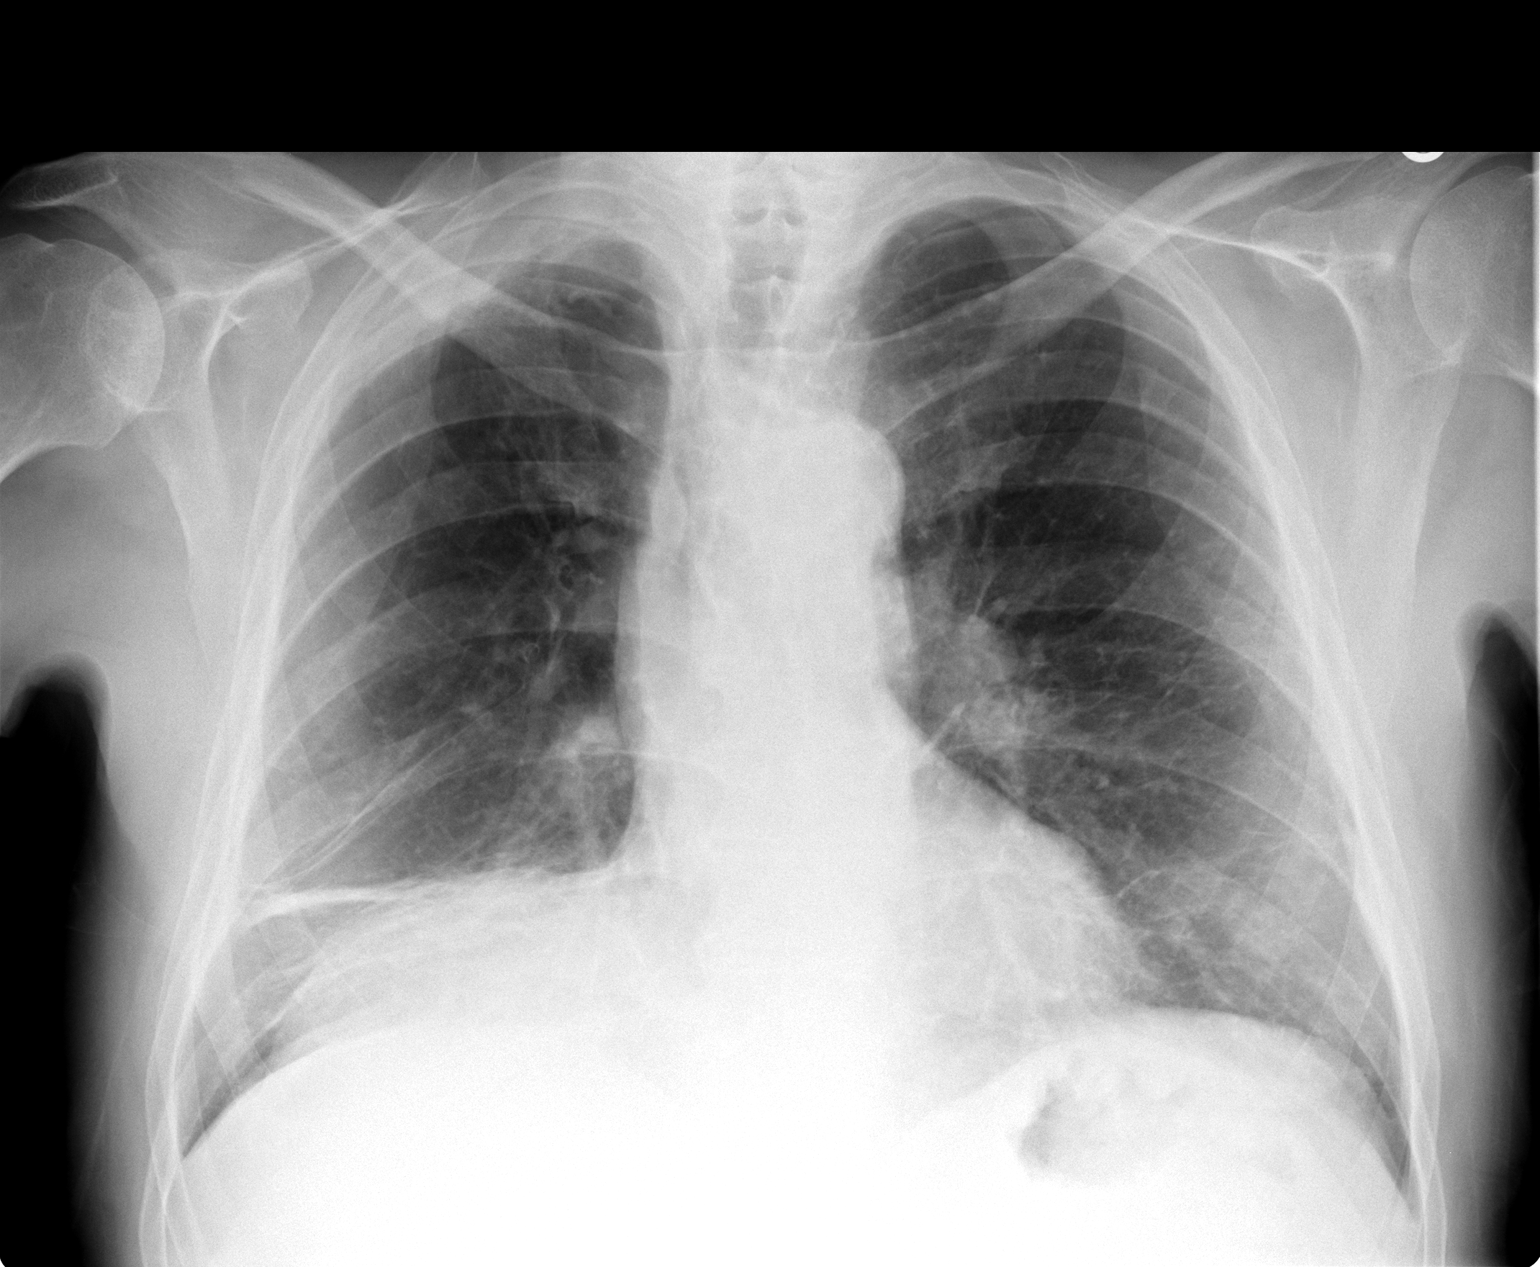

[view not recorded (2 of 2)]
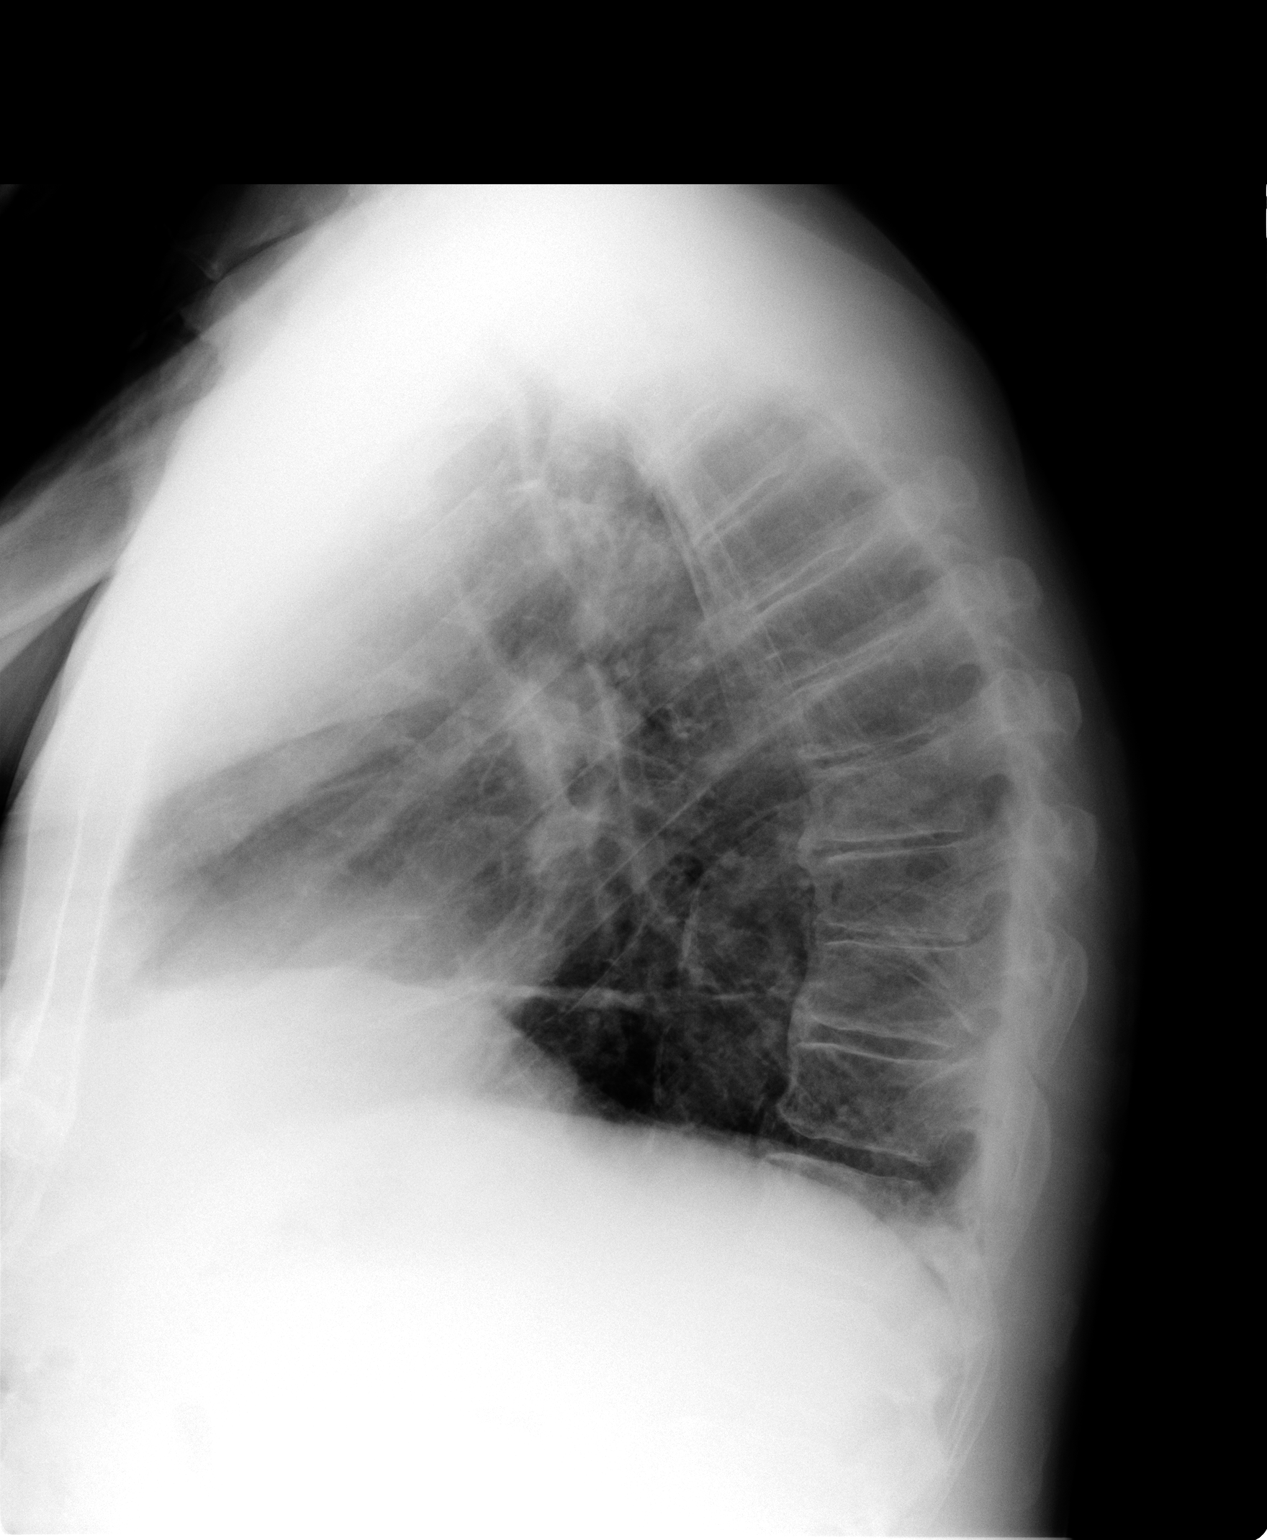

[2 of 2 positions shown; findings below may reference images not displayed]

FINDINGS: Linear opacities in the left lower lobe most likely represent
scarring and atelectasis with lesser linear opacities in the left
lower lobe as well. There is some elevation of the right
hemidiaphragm. No definite infiltrate or effusion is seen. There is
considerable opacity in the right lung apex. This could simply
represent apical scarring, but an apical malignancy cannot be
excluded and CT of the chest may be helpful to assess more
sensitively. In addition there is some nodularity of the left hilum
and CT the chest with IV contrast would be helpful in evaluating
this area as well. The heart is within normal limits in size for
age. There appears to be destruction of the distal right clavicle
with adjacent soft tissue mass. This would be unusual for post
traumatic change or change due to arthritis, and either infection
with osteomyelitis or tumor would be considerations. Again CT of the
chest is recommended to assess all of these areas in more detail.
There are degenerative changes throughout the thoracic spine.
IMPRESSION: 1. Right apical opacity could be due to pleural thickening, but a
right apical tumor cannot be excluded.
2. Destruction of the distal right clavicle with soft tissue mass
adjacent. Recommend CT of the chest to evaluate for either tumor or
infection.
3. Basilar linear opacities right greater than left most consistent
with scarring and atelectasis with some elevation of the right
hemidiaphragm.
4. Nodularity of the left hilum. Cannot exclude adenopathy. This
area would also be assessed by CT of the chest with IV contrast
media.
